# Patient Record
Sex: Male | Born: 1995 | Race: Black or African American | Hispanic: No | Marital: Single | Smoking: Never smoker
Health system: Southern US, Community
[De-identification: ages and names within clinical notes are randomized; demographics above are authoritative.]

## PROBLEM LIST (undated history)

## (undated) HISTORY — PX: APPENDECTOMY: SHX54

---
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
## 2014-04-18 IMAGING — CR DG ANKLE COMPLETE 3+V*R*
3 series · 3 of 3 positions shown · non-contrast
Comparison: None.

CLINICAL DATA: Right ankle pain

RIGHT ANKLE - COMPLETE 3+ VIEW

[x ankle ap right]
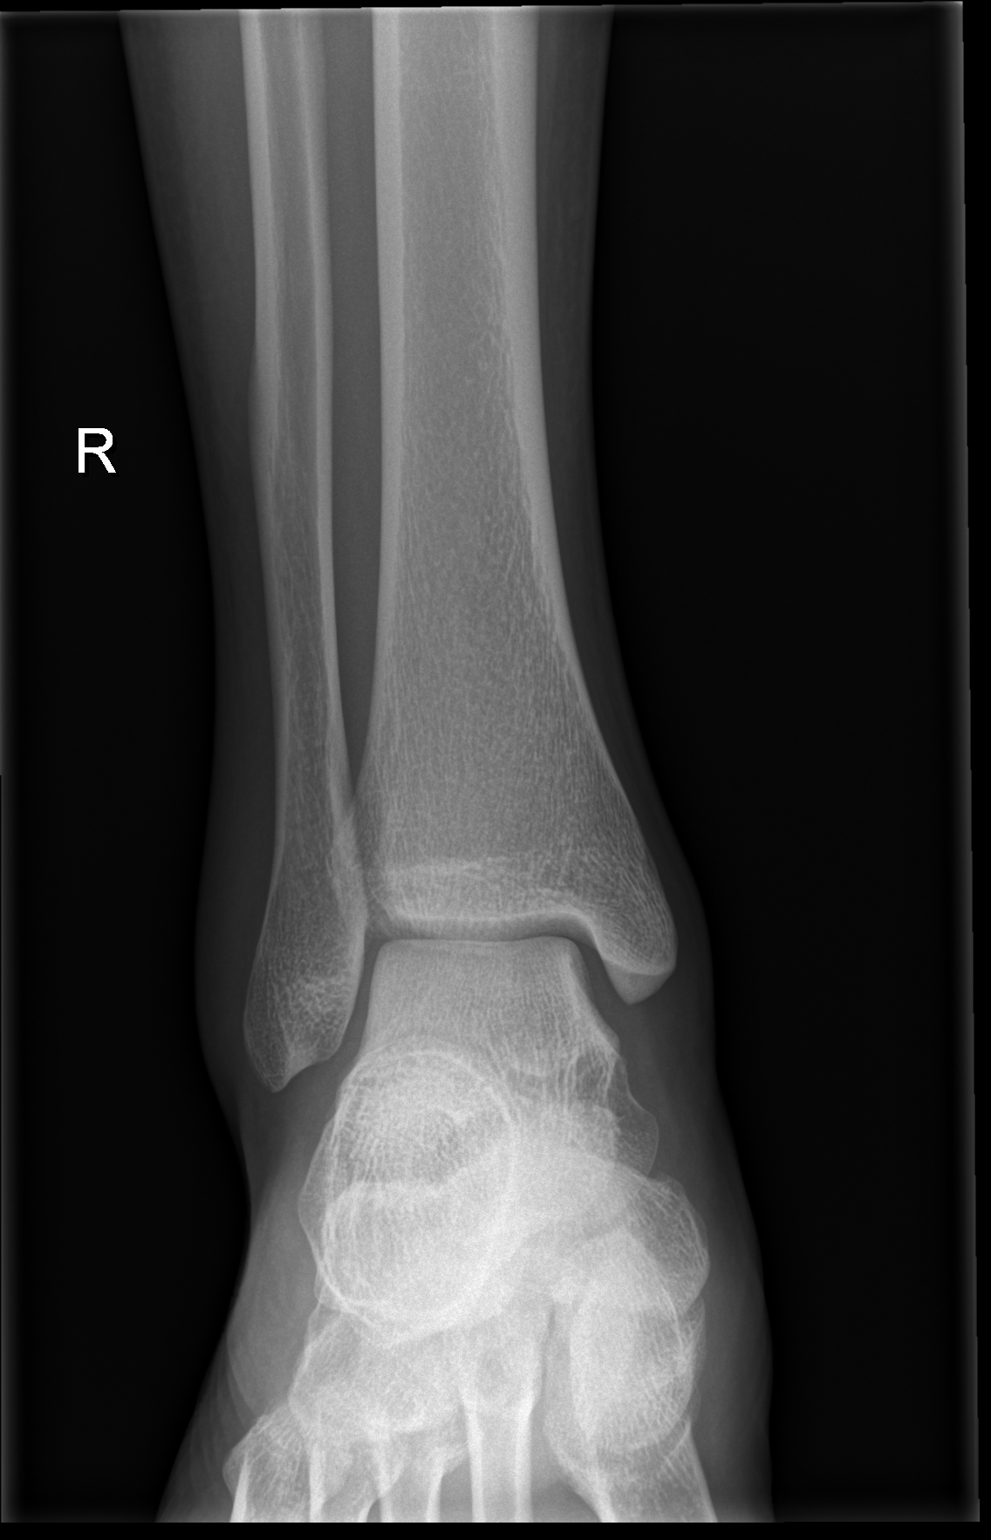

[x ankle obl right]
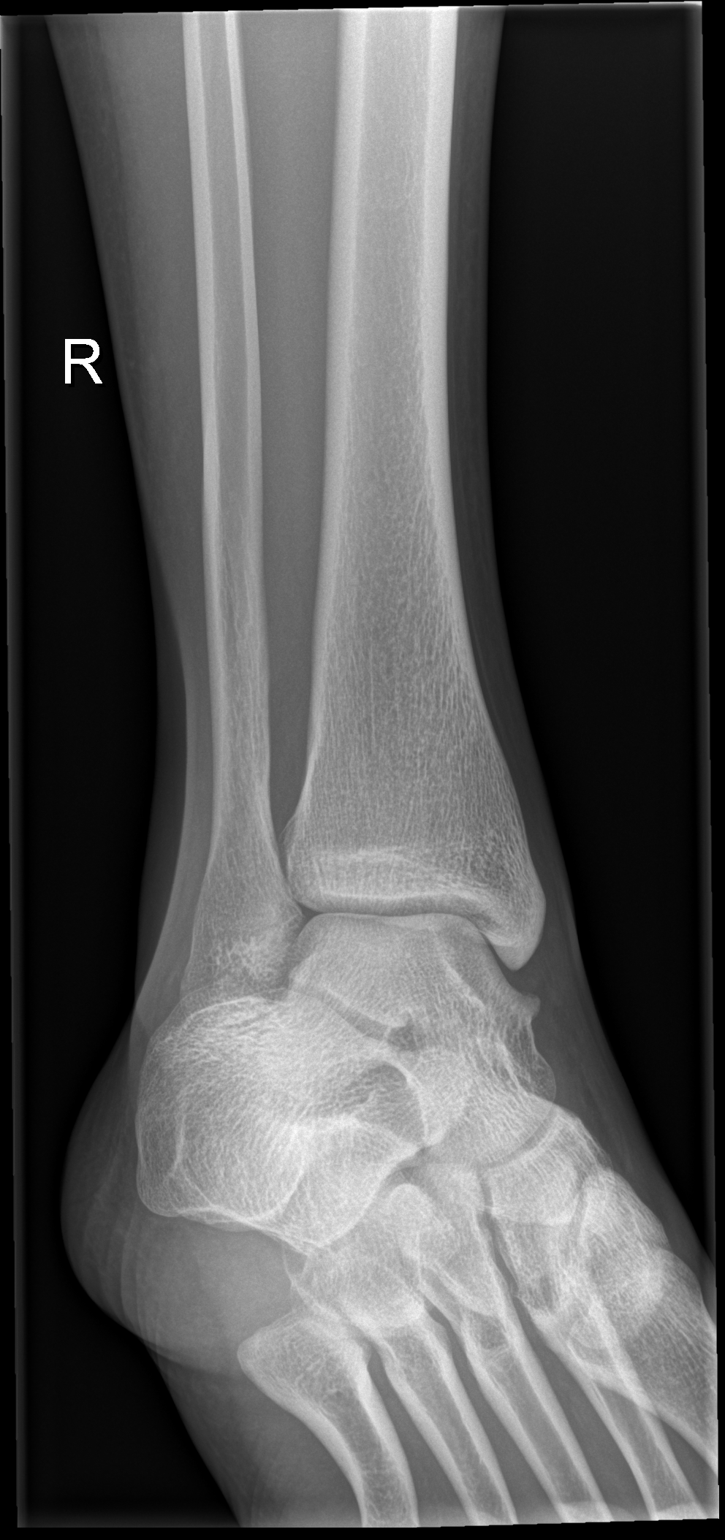

[x ankle lat right]
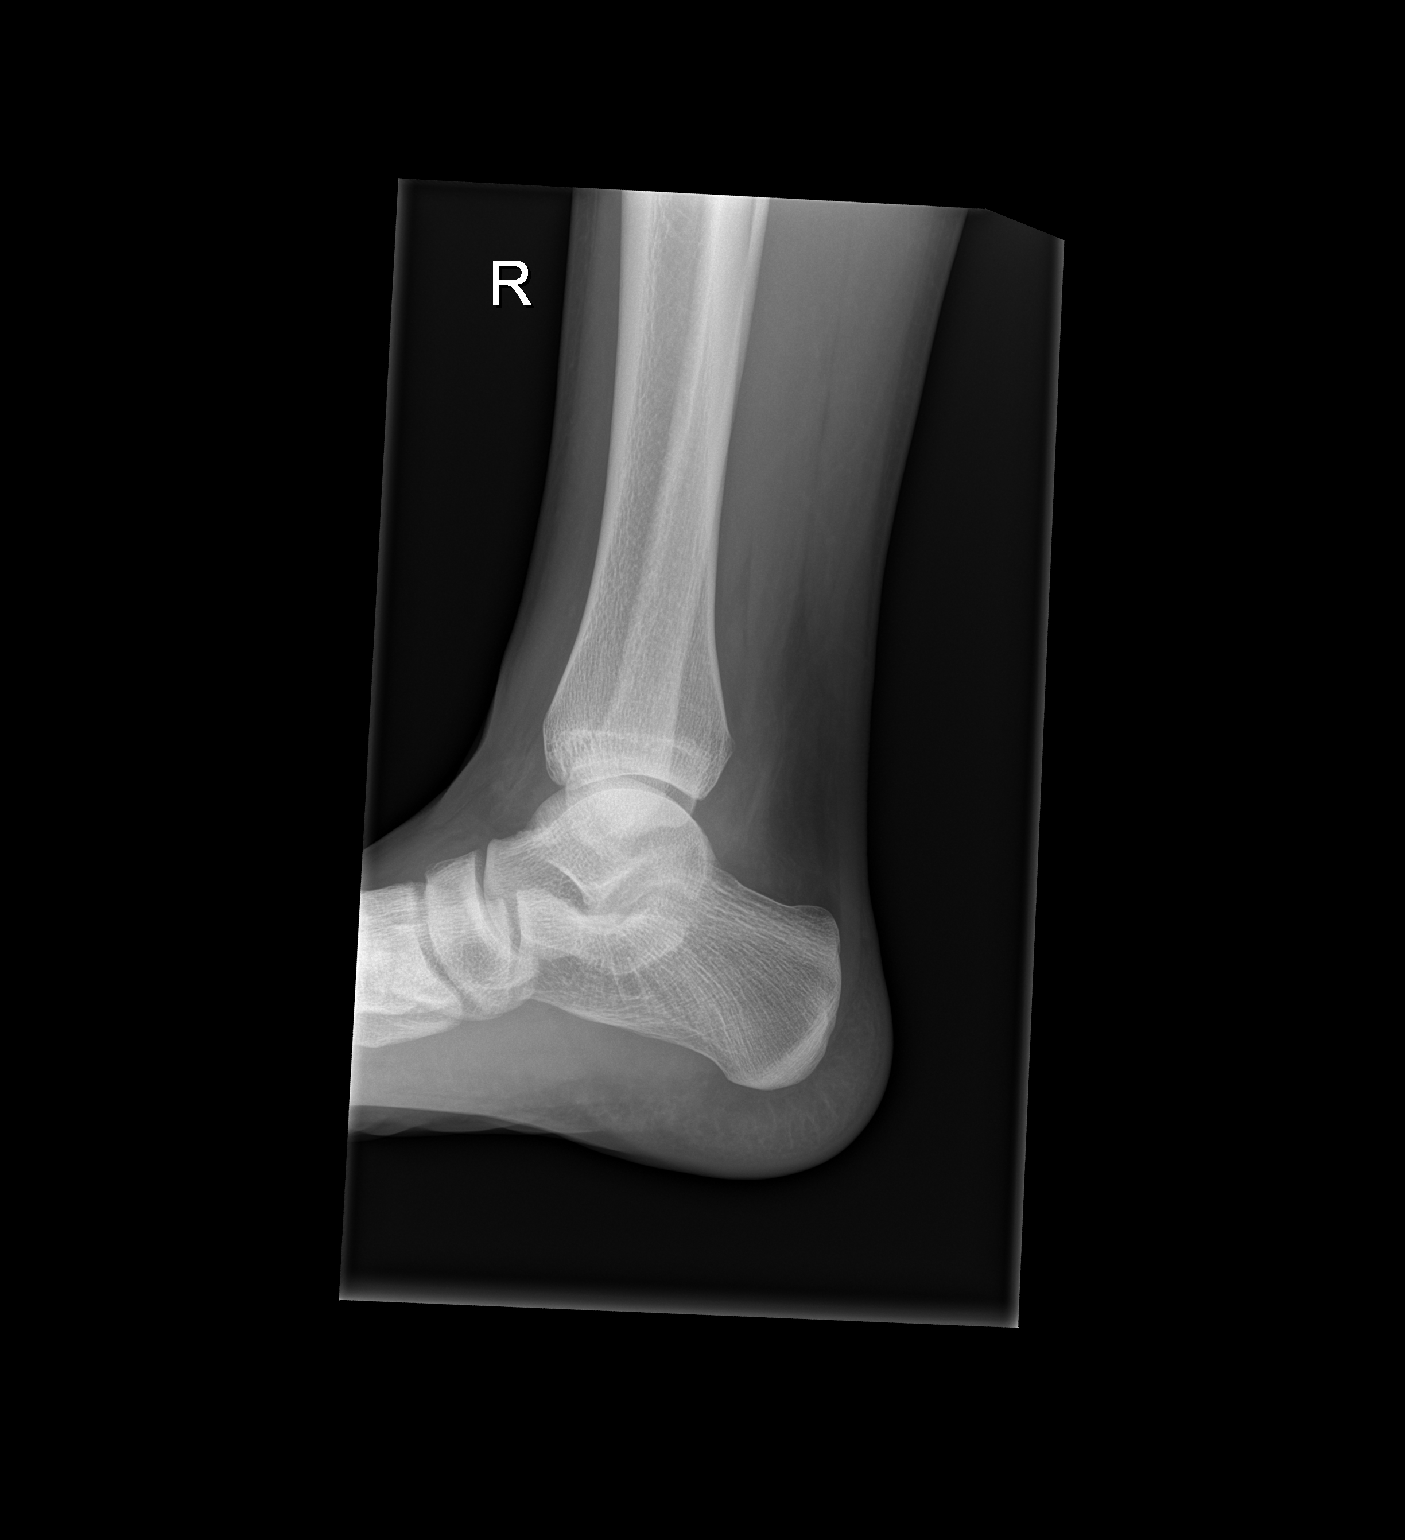

[3 of 3 positions shown; findings below may reference images not displayed]

FINDINGS: Diffuse soft tissue swelling, most pronounced overlying
the lateral malleolus.  Ankle mortise intact.  No displaced
fracture.  No dislocation.
IMPRESSION: Soft tissue swelling without acute osseous finding. If clinical
concern for a fracture persists, recommend a repeat radiograph in 5-
10 days to evaluate for interval change or callus formation.

## 2014-12-13 IMAGING — CR DG HAND COMPLETE 3+V*R*
3 series · 3 of 3 positions shown · non-contrast
Comparison: None.

CLINICAL DATA: Injury, pain.

RIGHT HAND - COMPLETE 3+ VIEW

[x hand pa right]
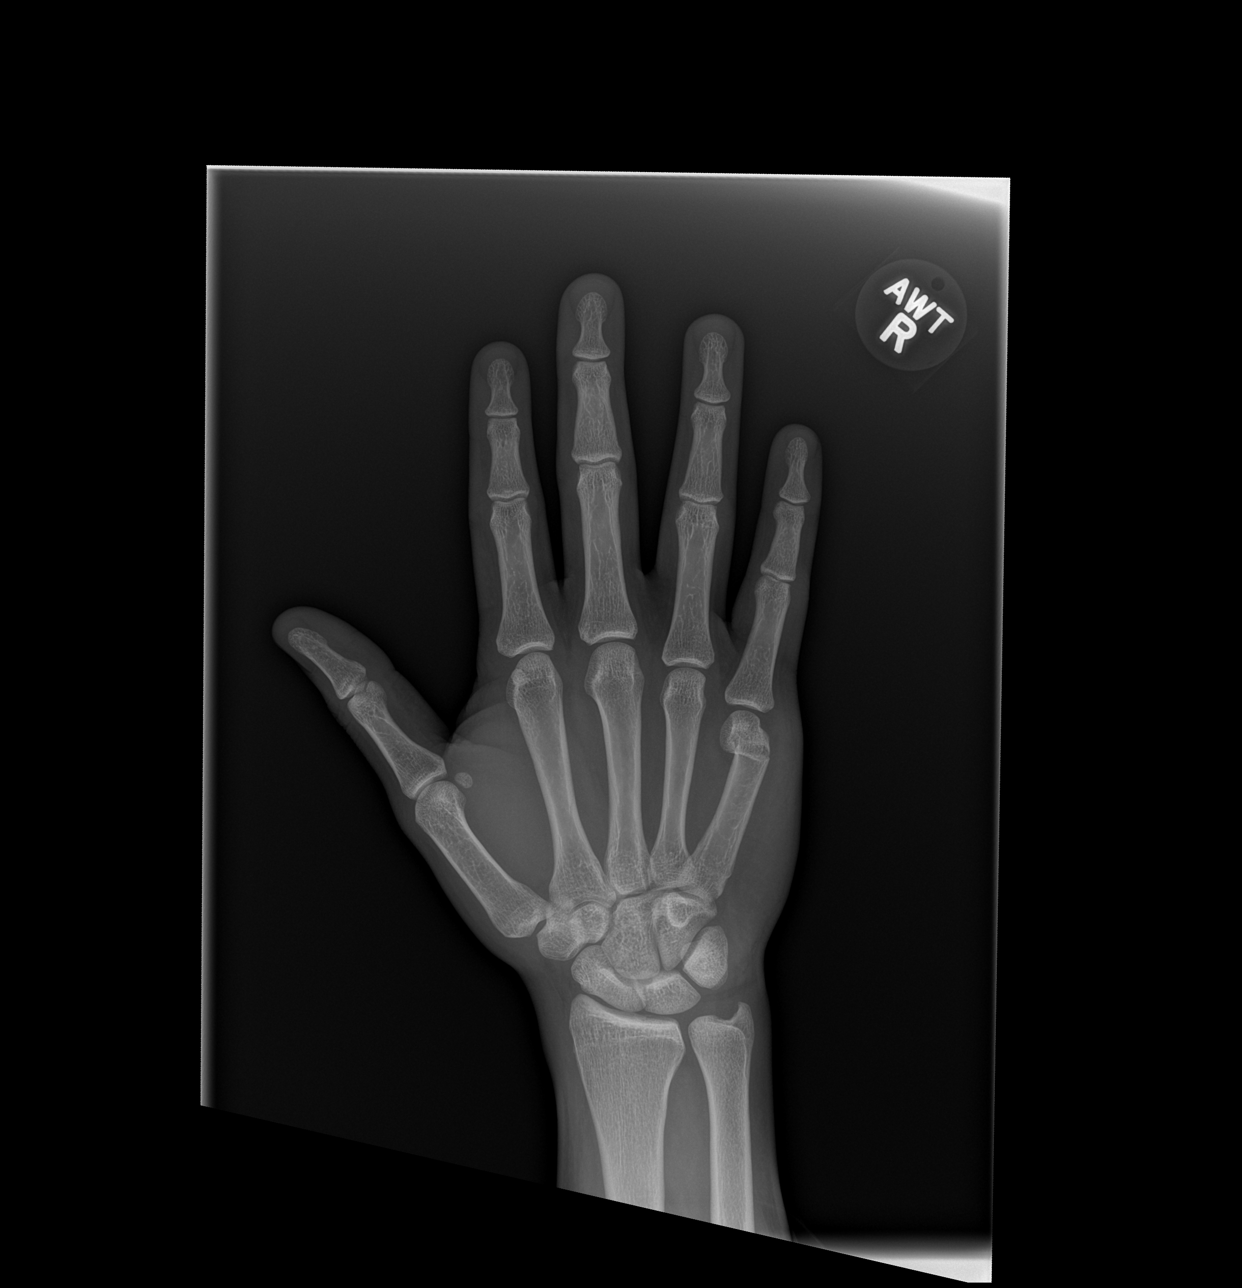

[x hand obl right]
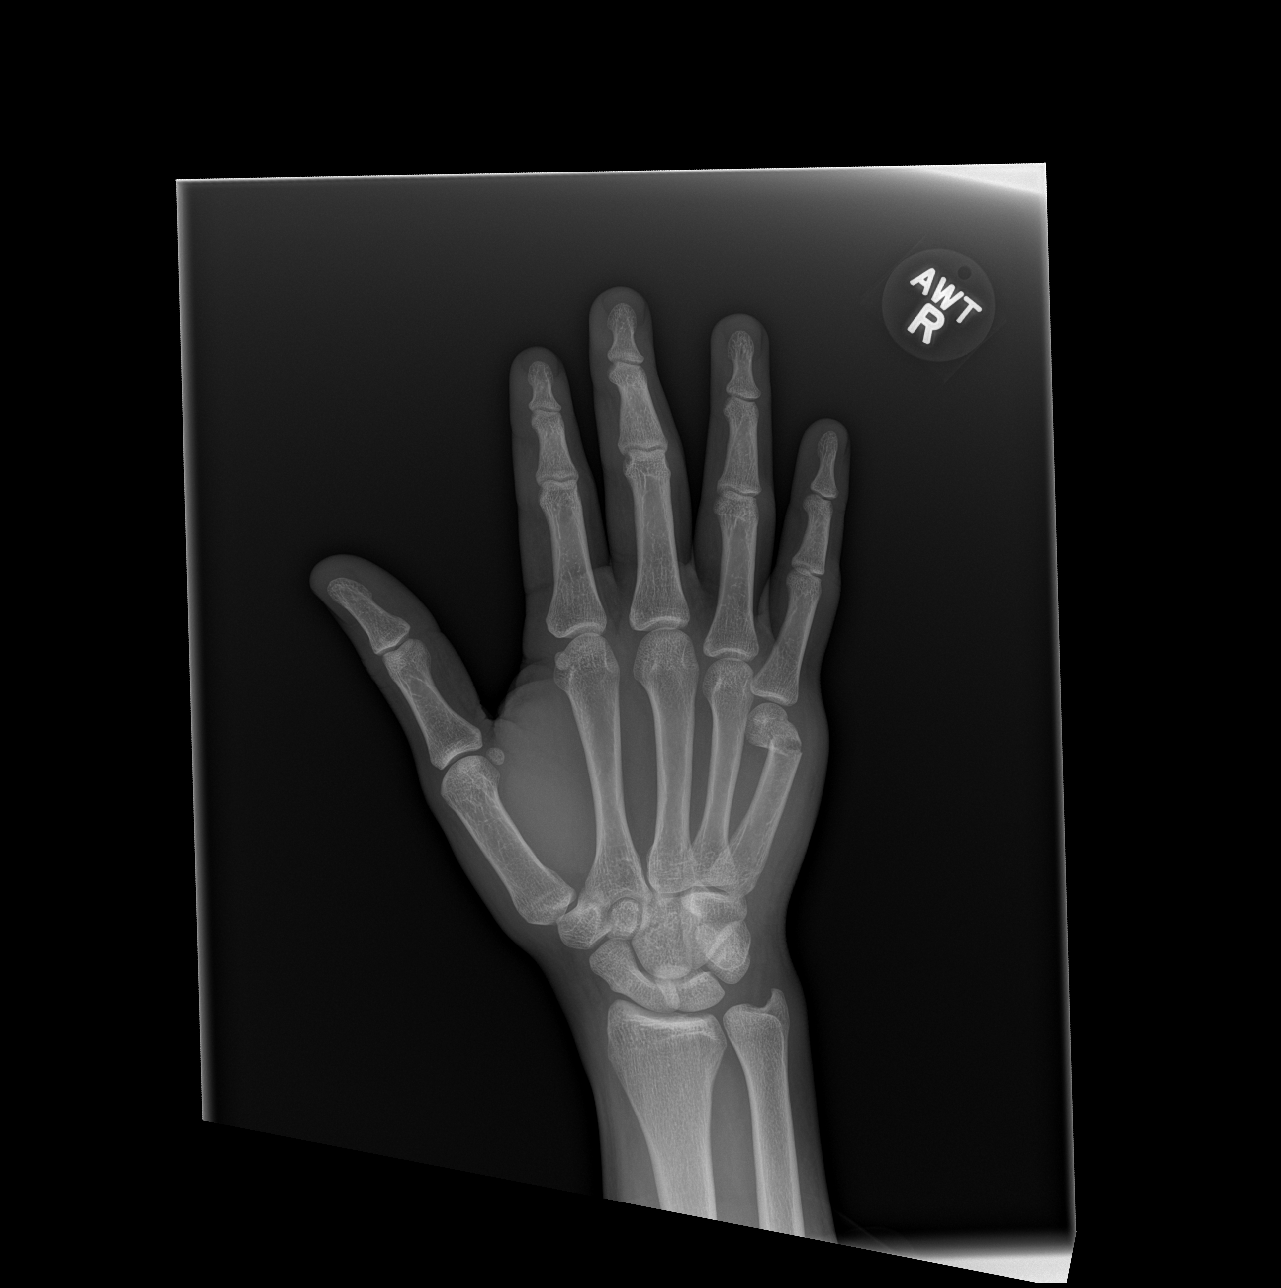

[x hand lat right]
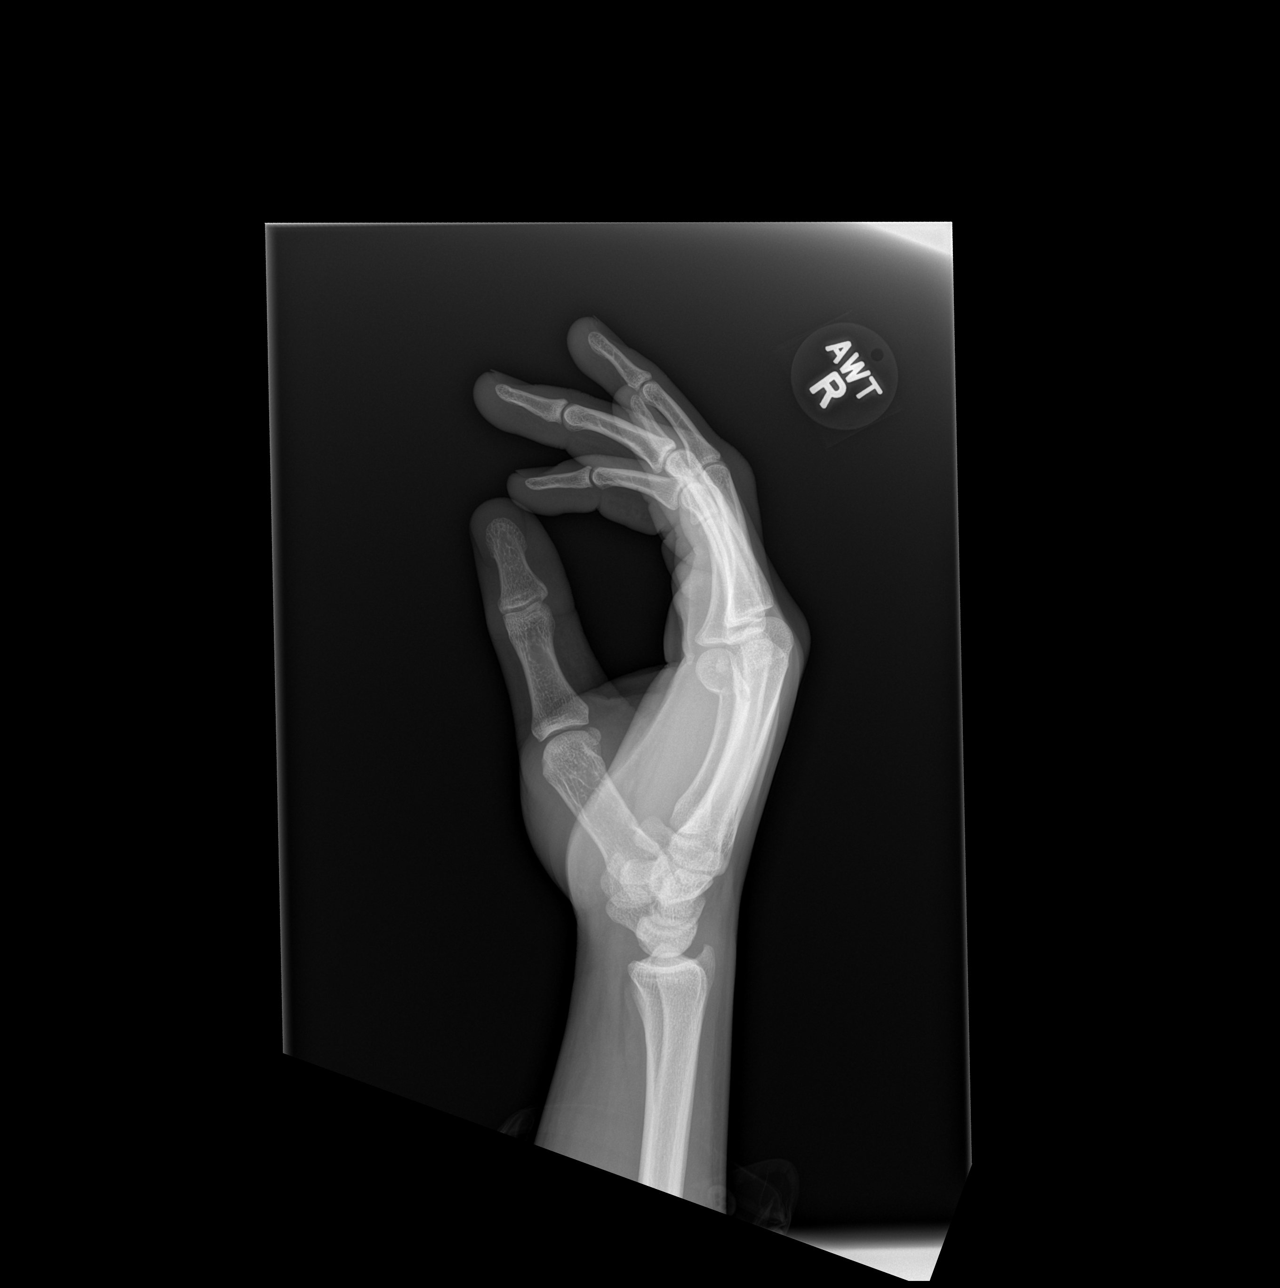

[3 of 3 positions shown; findings below may reference images not displayed]

FINDINGS: The patient has a fracture of the neck of the fifth
metacarpal with volar and medial angulation.  No other acute bony
or joint abnormality is identified.
IMPRESSION: Acute fracture neck of the fifth metacarpal.

## 2019-04-13 ENCOUNTER — Encounter (HOSPITAL_COMMUNITY): Payer: Self-pay | Admitting: Emergency Medicine

## 2019-04-13 ENCOUNTER — Telehealth: Payer: Self-pay | Admitting: *Deleted

## 2019-04-13 ENCOUNTER — Other Ambulatory Visit: Payer: Self-pay

## 2019-04-13 ENCOUNTER — Emergency Department (HOSPITAL_COMMUNITY)
Admission: EM | Admit: 2019-04-13 | Discharge: 2019-04-13 | Disposition: A | Discharge status: Home / Self Care | Payer: 59 | Attending: Emergency Medicine | Admitting: Emergency Medicine

## 2019-04-13 DIAGNOSIS — Z202 Contact with and (suspected) exposure to infections with a predominantly sexual mode of transmission: Secondary | ICD-10-CM | POA: Insufficient documentation

## 2019-04-13 DIAGNOSIS — N5089 Other specified disorders of the male genital organs: Secondary | ICD-10-CM | POA: Diagnosis present

## 2019-04-13 DIAGNOSIS — Z79899 Other long term (current) drug therapy: Secondary | ICD-10-CM | POA: Insufficient documentation

## 2019-04-13 DIAGNOSIS — Z711 Person with feared health complaint in whom no diagnosis is made: Secondary | ICD-10-CM

## 2019-04-13 DIAGNOSIS — N485 Ulcer of penis: Secondary | ICD-10-CM | POA: Diagnosis not present

## 2019-04-13 LAB — RPR
RPR Ser Ql: REACTIVE — AB
RPR Titer: 1:8 {titer}

## 2019-04-13 LAB — RAPID HIV SCREEN (HIV 1/2 AB+AG)
HIV 1/2 Antibodies: NONREACTIVE
HIV-1 P24 Antigen - HIV24: REACTIVE — AB

## 2019-04-13 MED ORDER — DOXYCYCLINE HYCLATE 100 MG PO CAPS: 100.0000 mg | ORAL_CAPSULE | Freq: Two times a day (BID) | ORAL | 0 refills | Status: AC

## 2019-04-13 MED ORDER — CEFTRIAXONE SODIUM 500 MG IJ SOLR
500.0000 mg | Freq: Once | INTRAMUSCULAR | Status: AC
  Administered 2019-04-13: 500 mg via INTRAMUSCULAR
  Filled 2019-04-13: qty 500

## 2019-04-13 MED ORDER — AZITHROMYCIN 250 MG PO TABS
1000.0000 mg | ORAL_TABLET | Freq: Once | ORAL | Status: AC
  Administered 2019-04-13: 1000 mg via ORAL
  Filled 2019-04-13: qty 4

## 2019-04-13 MED ORDER — LIDOCAINE HCL (PF) 1 % IJ SOLN
INTRAMUSCULAR | Status: AC
  Administered 2019-04-13: 1 mL
  Filled 2019-04-13: qty 5

## 2019-04-13 NOTE — Telephone Encounter (Signed)
Sent referral to [facility] per Dr Hatcher. Wynette Jersey M, RN  

## 2019-04-13 NOTE — Discharge Instructions (Signed)
You were seen today for a penile lesion.  While you report trauma, it is highly suspicious for possible STD.  You were tested and treated.  Syphilis, HIV, and culture testing is pending.  You will receive a phone call if any if this is positive.  You should abstain from sexual activity for the next 10 to 14 days and until the lesion has completely healed.  Continue to apply antibiotic ointment.

## 2019-04-13 NOTE — ED Triage Notes (Signed)
Pt reports he injured his penis w/ the zipper on his pants.  Pt states the wound has continued to get worse.

## 2019-04-13 NOTE — ED Provider Notes (Signed)
[facility] EMERGENCY DEPARTMENT Provider Note   CSN: [id number] Arrival date & time: 04/13/19  0106     History Chief Complaint  Patient presents with  . Penis Injury    Billy Lee is a 24 y.o. male.  HPI     This is a 24 year old male who presents with a nonhealing wound on his penis.  Patient reports that at the end of February he was "talking on the phone with some girlfriends" and pulled up his pants when his zipper caught his pants.  He reports he has had an injury like this before.  However, he states that he has been unhealed.  He reports pain even when he wears boxers.  He does report high risk sexual activity and does not consistently use condoms.  Denies any penile discharge.  He has been using moisturizer with minimal relief.  No known history of STDs.  History reviewed. No pertinent past medical history.  There are no problems to display for this patient.   Past Surgical History:  Procedure Laterality Date  . APPENDECTOMY         No family history on file.  Social History   Tobacco Use  . Smoking status: Never Smoker  . Smokeless tobacco: Never Used  Substance Use Topics  . Alcohol use: No  . Drug use: No    Home Medications Prior to Admission medications   Medication Sig Start Date End Date Taking? Authorizing Provider  doxycycline (VIBRAMYCIN) 100 MG capsule Take 1 capsule (100 mg total) by mouth 2 (two) times daily. 04/13/19   Horton, Barbette Hair, MD  HYDROcodone-acetaminophen (NORCO/VICODIN) 5-325 MG per tablet Take 1 tablet by mouth every 4 (four) hours as needed for moderate pain or severe pain. 06/09/13   Clayton Bibles, PA-C  penicillin v potassium (VEETID) 500 MG tablet Take 1 tablet (500 mg total) by mouth 4 (four) times daily. 06/28/13   Montine Circle, PA-C    Allergies    Patient has no known allergies.  Review of Systems   Review of Systems  Constitutional: Negative for fever.  Respiratory: Negative for shortness  of breath.   Cardiovascular: Negative for chest pain.  Gastrointestinal: Negative for abdominal pain.  Genitourinary: Positive for penile pain. Negative for discharge and penile swelling.  All other systems reviewed and are negative.   Physical Exam Updated Vital Signs BP 118/68   Pulse 68   Temp 98.3 F (36.8 C)   Resp 18   SpO2 100%   Physical Exam Vitals and nursing note reviewed.  Constitutional:      Appearance: He is well-developed.  HENT:     Head: Normocephalic and atraumatic.  Eyes:     Pupils: Pupils are equal, round, and reactive to light.  Cardiovascular:     Rate and Rhythm: Normal rate and regular rhythm.  Pulmonary:     Effort: Pulmonary effort is normal. No respiratory distress.  Abdominal:     Palpations: Abdomen is soft.     Tenderness: There is no abdominal tenderness.  Genitourinary:    Comments: Circumcised penis, no obvious discharge, there is a large ulcerative lesion at the base of the glans with an erythematous base, is tender to palpation, no oozing noted no other obvious lesions Musculoskeletal:     Cervical back: Neck supple.  Skin:    General: Skin is warm and dry.     Findings: No rash.     Comments: No rashes noted particularly on the palms or soles  Neurological:     Mental Status: He is alert and oriented to person, place, and time.  Psychiatric:        Mood and Affect: Mood normal.     ED Results / Procedures / Treatments   Labs (all labs ordered are listed, but only abnormal results are displayed) Labs Reviewed  HSV CULTURE AND TYPING  RPR  RAPID HIV SCREEN (HIV 1/2 AB+AG)  GC/CHLAMYDIA PROBE AMP ([place]) NOT AT [facility]    EKG None  Radiology No results found.  Procedures Procedures (including critical care time)  Medications Ordered in ED Medications  azithromycin (ZITHROMAX) tablet 1,000 mg (1,000 mg Oral Given 04/13/19 0541)  cefTRIAXone (ROCEPHIN) injection 500 mg (500 mg Intramuscular Given 04/13/19 0541)   lidocaine (PF) (XYLOCAINE) 1 % injection (1 mL  Given 04/13/19 0545)    ED Course  I have reviewed the triage vital signs and the nursing notes.  Pertinent labs & imaging results that were available during my care of the patient were reviewed by me and considered in my medical decision making (see chart for details).    MDM Rules/Calculators/A&P                       Patient presents with injury and ulceration to the glans of his penis.  Worsening over last 2 weeks.  Lesion is highly suspicious for primary ulcer and does not appear consistent with an injury.  Patient does have high risk sexual behaviors.  It could be chancroid vs syphilis.  Less likely is herpes.  Patient was tested and treated.  He was given azithromycin, Rocephin, and will be discharged with doxycycline.  RPR, HIV, and HSV testing are pending.  We discussed safe sex practices.  Instructed to use topical antibiotic ointment.  After history, exam, and medical workup I feel the patient has been appropriately medically screened and is safe for discharge home. Pertinent diagnoses were discussed with the patient. Patient was given return precautions.   Final Clinical Impression(s) / ED Diagnoses Final diagnoses:  Penile ulcer  Concern about STD in male without diagnosis    Rx / DC Orders ED Discharge Orders         Ordered    doxycycline (VIBRAMYCIN) 100 MG capsule  2 times daily     04/13/19 0604           Horton, Mayer Masker, MD 04/13/19 [phone]

## 2019-04-14 LAB — GC/CHLAMYDIA PROBE AMP (~~LOC~~) NOT AT ARMC
Chlamydia: POSITIVE — AB
Neisseria Gonorrhea: POSITIVE — AB

## 2019-04-14 LAB — T.PALLIDUM AB, TOTAL: T Pallidum Abs: REACTIVE — AB

## 2019-04-15 LAB — HSV CULTURE AND TYPING

## 2019-04-15 LAB — RNA QUALITATIVE: HIV 1 RNA Qualitative: 1

## 2019-04-15 LAB — HIV-1/2 AB - DIFFERENTIATION
HIV 1 Ab: NEGATIVE
HIV 2 Ab: NEGATIVE
Note: NEGATIVE

## 2021-11-04 ENCOUNTER — Encounter (HOSPITAL_COMMUNITY): Payer: Self-pay

## 2021-11-04 ENCOUNTER — Emergency Department (HOSPITAL_COMMUNITY)
Admission: EM | Admit: 2021-11-04 | Discharge: 2021-11-04 | Discharge status: Against Medical Advice | Payer: No Typology Code available for payment source

## 2021-11-04 ENCOUNTER — Emergency Department (HOSPITAL_COMMUNITY)
Admission: EM | Admit: 2021-11-04 | Discharge: 2021-11-04 | Disposition: A | Discharge status: Home / Self Care | Payer: Self-pay | Attending: Emergency Medicine | Admitting: Emergency Medicine

## 2021-11-04 ENCOUNTER — Other Ambulatory Visit: Payer: Self-pay

## 2021-11-04 DIAGNOSIS — Z041 Encounter for examination and observation following transport accident: Secondary | ICD-10-CM | POA: Diagnosis present

## 2021-11-04 DIAGNOSIS — Y92410 Unspecified street and highway as the place of occurrence of the external cause: Secondary | ICD-10-CM | POA: Insufficient documentation
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
## 2021-11-04 NOTE — ED Notes (Signed)
Pt left [facility] due to long wait times. Pt stated they would seek medical attention at [facility].

## 2021-11-04 NOTE — Discharge Instructions (Signed)
Return to the emergency department if you develop any new and/or concerning symptoms. °

## 2021-12-18 DIAGNOSIS — Z0389 Encounter for observation for other suspected diseases and conditions ruled out: Secondary | ICD-10-CM | POA: Diagnosis not present

## 2021-12-18 DIAGNOSIS — Z3009 Encounter for other general counseling and advice on contraception: Secondary | ICD-10-CM | POA: Diagnosis not present

## 2021-12-18 DIAGNOSIS — Z1388 Encounter for screening for disorder due to exposure to contaminants: Secondary | ICD-10-CM | POA: Diagnosis not present

## 2021-12-27 DIAGNOSIS — Z419 Encounter for procedure for purposes other than remedying health state, unspecified: Secondary | ICD-10-CM | POA: Diagnosis not present

## 2022-01-27 DIAGNOSIS — Z419 Encounter for procedure for purposes other than remedying health state, unspecified: Secondary | ICD-10-CM | POA: Diagnosis not present

## 2022-02-27 DIAGNOSIS — Z419 Encounter for procedure for purposes other than remedying health state, unspecified: Secondary | ICD-10-CM | POA: Diagnosis not present

## 2022-03-28 DIAGNOSIS — Z419 Encounter for procedure for purposes other than remedying health state, unspecified: Secondary | ICD-10-CM | POA: Diagnosis not present

## 2022-04-28 DIAGNOSIS — Z419 Encounter for procedure for purposes other than remedying health state, unspecified: Secondary | ICD-10-CM | POA: Diagnosis not present

## 2022-05-28 DIAGNOSIS — Z419 Encounter for procedure for purposes other than remedying health state, unspecified: Secondary | ICD-10-CM | POA: Diagnosis not present

## 2022-06-28 DIAGNOSIS — Z419 Encounter for procedure for purposes other than remedying health state, unspecified: Secondary | ICD-10-CM | POA: Diagnosis not present

## 2022-07-28 DIAGNOSIS — Z419 Encounter for procedure for purposes other than remedying health state, unspecified: Secondary | ICD-10-CM | POA: Diagnosis not present

## 2022-08-28 DIAGNOSIS — Z419 Encounter for procedure for purposes other than remedying health state, unspecified: Secondary | ICD-10-CM | POA: Diagnosis not present

## 2022-09-28 DIAGNOSIS — Z419 Encounter for procedure for purposes other than remedying health state, unspecified: Secondary | ICD-10-CM | POA: Diagnosis not present

## 2022-10-28 DIAGNOSIS — Z419 Encounter for procedure for purposes other than remedying health state, unspecified: Secondary | ICD-10-CM | POA: Diagnosis not present

## 2022-11-28 DIAGNOSIS — Z419 Encounter for procedure for purposes other than remedying health state, unspecified: Secondary | ICD-10-CM | POA: Diagnosis not present

## 2022-12-28 DIAGNOSIS — Z419 Encounter for procedure for purposes other than remedying health state, unspecified: Secondary | ICD-10-CM | POA: Diagnosis not present

## 2023-01-28 DIAGNOSIS — Z419 Encounter for procedure for purposes other than remedying health state, unspecified: Secondary | ICD-10-CM | POA: Diagnosis not present

## 2023-02-28 DIAGNOSIS — Z419 Encounter for procedure for purposes other than remedying health state, unspecified: Secondary | ICD-10-CM | POA: Diagnosis not present

## 2023-03-28 DIAGNOSIS — Z419 Encounter for procedure for purposes other than remedying health state, unspecified: Secondary | ICD-10-CM | POA: Diagnosis not present

## 2023-05-09 DIAGNOSIS — Z419 Encounter for procedure for purposes other than remedying health state, unspecified: Secondary | ICD-10-CM | POA: Diagnosis not present

## 2023-06-08 DIAGNOSIS — Z419 Encounter for procedure for purposes other than remedying health state, unspecified: Secondary | ICD-10-CM | POA: Diagnosis not present

## 2023-07-09 DIAGNOSIS — Z419 Encounter for procedure for purposes other than remedying health state, unspecified: Secondary | ICD-10-CM | POA: Diagnosis not present

## 2023-08-08 DIAGNOSIS — Z419 Encounter for procedure for purposes other than remedying health state, unspecified: Secondary | ICD-10-CM | POA: Diagnosis not present

## 2023-09-08 DIAGNOSIS — Z419 Encounter for procedure for purposes other than remedying health state, unspecified: Secondary | ICD-10-CM | POA: Diagnosis not present

## 2023-10-09 DIAGNOSIS — Z419 Encounter for procedure for purposes other than remedying health state, unspecified: Secondary | ICD-10-CM | POA: Diagnosis not present

## 2023-11-27 ENCOUNTER — Emergency Department (HOSPITAL_BASED_OUTPATIENT_CLINIC_OR_DEPARTMENT_OTHER)
Admission: EM | Admit: 2023-11-27 | Discharge: 2023-11-28 | Disposition: A | Discharge status: Home / Self Care | Attending: Emergency Medicine | Admitting: Emergency Medicine

## 2023-11-27 ENCOUNTER — Encounter (HOSPITAL_BASED_OUTPATIENT_CLINIC_OR_DEPARTMENT_OTHER): Payer: Self-pay

## 2023-11-27 DIAGNOSIS — K648 Other hemorrhoids: Secondary | ICD-10-CM | POA: Diagnosis not present

## 2023-11-27 DIAGNOSIS — K6289 Other specified diseases of anus and rectum: Secondary | ICD-10-CM | POA: Diagnosis present

## 2023-11-27 LAB — CBC
HCT: 36.3 % — ABNORMAL LOW (ref 39.0–52.0)
Hemoglobin: 12 g/dL — ABNORMAL LOW (ref 13.0–17.0)
MCH: 29.1 pg (ref 26.0–34.0)
MCHC: 33.1 g/dL (ref 30.0–36.0)
MCV: 88.1 fL (ref 80.0–100.0)
Platelets: 176 K/uL (ref 150–400)
RBC: 4.12 MIL/uL — ABNORMAL LOW (ref 4.22–5.81)
RDW: 11.9 % (ref 11.5–15.5)
WBC: 4.4 K/uL (ref 4.0–10.5)
nRBC: 0 % (ref 0.0–0.2)

## 2023-11-27 LAB — COMPREHENSIVE METABOLIC PANEL WITH GFR
ALT: 15 U/L (ref 0–44)
AST: 16 U/L (ref 15–41)
Albumin: 4.5 g/dL (ref 3.5–5.0)
Alkaline Phosphatase: 57 U/L (ref 38–126)
Anion gap: 7 (ref 5–15)
BUN: 14 mg/dL (ref 6–20)
CO2: 26 mmol/L (ref 22–32)
Calcium: 9.7 mg/dL (ref 8.9–10.3)
Chloride: 106 mmol/L (ref 98–111)
Creatinine, Ser: 1.06 mg/dL (ref 0.61–1.24)
GFR, Estimated: >60 mL/min (ref >60)
Glucose, Bld: 104 mg/dL — ABNORMAL HIGH (ref 70–99)
Potassium: 4.4 mmol/L (ref 3.5–5.1)
Sodium: 139 mmol/L (ref 135–145)
Total Bilirubin: 0.3 mg/dL (ref 0.0–1.2)
Total Protein: 7.3 g/dL (ref 6.5–8.1)

## 2023-11-27 LAB — LIPASE, BLOOD: Lipase: 14 U/L (ref 11–51)

## 2023-11-27 MED ORDER — OXYCODONE-ACETAMINOPHEN 5-325 MG PO TABS
1.0000 | ORAL_TABLET | ORAL | Status: DC | PRN
  Administered 2023-11-27: 1 via ORAL
  Filled 2023-11-27: qty 1

## 2023-11-27 MED ORDER — LIDOCAINE HCL URETHRAL/MUCOSAL 2 % EX GEL
1.0000 | Freq: Once | CUTANEOUS | Status: AC
  Administered 2023-11-27: 1 via TOPICAL
  Filled 2023-11-27: qty 11

## 2023-11-27 NOTE — ED Triage Notes (Signed)
 Patient reports increased rectal pain and swelling. It has been going on for a week but is getting worse and he now has rectal bleeding with purulent drainage. She says it is very uncomfortable and tender to touch and position changes.

## 2023-11-28 MED ORDER — POLYETHYLENE GLYCOL 3350 17 G PO PACK
17.0000 g | PACK | Freq: Every day | ORAL | 1 refills | Status: DC
Start: 2023-11-28 — End: 2023-11-28

## 2023-11-28 MED ORDER — POLYETHYLENE GLYCOL 3350 17 G PO PACK: 17.0000 g | PACK | Freq: Every day | ORAL | 1 refills | Status: AC

## 2023-11-28 NOTE — ED Provider Notes (Signed)
 [facility] Emergency Department Provider Note MRN:  [id number]  Arrival date & time: 11/28/23     Chief Complaint   Rectal Pain   History of Present Illness   Billy Lee is a 28 y.o. year-old male with no pertinent past medical history presenting to the ED with chief complaint of rectal pain.  Patient has had intermittent rectal pain during defecation over the past several weeks.  Today after a bowel movement had considerably worse pain that would not go away and felt a bulge near the anal region, here for evaluation.  Review of Systems  A thorough review of systems was obtained and all systems are negative except as noted in the HPI and PMH.   Patient's Health History   History reviewed. No pertinent past medical history.  Past Surgical History:  Procedure Laterality Date   APPENDECTOMY      History reviewed. No pertinent family history.  Social History   Socioeconomic History   Marital status: Single    Spouse name: Not on file   Number of children: Not on file   Years of education: Not on file   Highest education level: Not on file  Occupational History   Not on file  Tobacco Use   Smoking status: Never   Smokeless tobacco: Never  Substance and Sexual Activity   Alcohol use: No   Drug use: No   Sexual activity: Not on file  Other Topics Concern   Not on file  Social History Narrative   Not on file   Social Drivers of Health   Financial Resource Strain: Not on file  Food Insecurity: Not on file  Transportation Needs: Not on file  Physical Activity: Not on file  Stress: Not on file  Social Connections: Not on file  Intimate Partner Violence: Not on file     Physical Exam   Vitals:   11/27/23 2129  BP: (!) 105/48  Pulse: 61  Resp: 16  Temp: 98.2 F (36.8 C)  SpO2: 98%    CONSTITUTIONAL: Well-appearing, NAD NEURO/PSYCH:  Alert and oriented x 3, no focal deficits EYES:  eyes equal and reactive ENT/NECK:  no LAD, no  JVD CARDIO: Regular rate, well-perfused, normal S1 and S2 PULM:  CTAB no wheezing or rhonchi GI/GU:  non-distended, non-tender MSK/SPINE:  No gross deformities, no edema SKIN:  no rash, atraumatic   *Additional and/or pertinent findings included in MDM below  Diagnostic and Interventional Summary    EKG Interpretation Date/Time:    Ventricular Rate:    PR Interval:    QRS Duration:    QT Interval:    QTC Calculation:   R Axis:      Text Interpretation:         Labs Reviewed  COMPREHENSIVE METABOLIC PANEL WITH GFR - Abnormal; Notable for the following components:      Result Value   Glucose, Bld 104 (*)    All other components within normal limits  CBC - Abnormal; Notable for the following components:   RBC 4.12 (*)    Hemoglobin 12.0 (*)    HCT 36.3 (*)    All other components within normal limits  LIPASE, BLOOD  URINALYSIS, ROUTINE W REFLEX MICROSCOPIC    No orders to display    Medications  oxyCODONE-acetaminophen  (PERCOCET/ROXICET) 5-325 MG per tablet 1 tablet (1 tablet Oral Given 11/27/23 2311)  lidocaine  (XYLOCAINE ) 2 % jelly 1 Application (1 Application Topical Given 11/27/23 2350)     Procedures  /  Critical Care Hemorrhoid reduction  Date/Time: 11/28/2023 12:43 AM  Performed by: Theadore Ozell HERO, MD Authorized by: Theadore Ozell HERO, MD  Consent: Verbal consent obtained Risks and benefits: risks, benefits and alternatives were discussed Local anesthesia used: yes  Anesthesia: Local anesthesia used: yes Local Anesthetic: topical anesthetic  Sedation: Patient sedated: no  Patient tolerance: patient tolerated the procedure well with no immediate complications Comments: Reduction of prolapsed hemorrhoids with gentle reinsertion.     ED Course and Medical Decision Making  Initial Impression and Ddx Exam is consistent with prolapsed hemorrhoids causing patient [place] of discomfort.  I see no evidence of infection or abscess, abdomen soft and  nontender.  Past medical/surgical history that increases complexity of ED encounter: None  Interpretation of Diagnostics I personally reviewed the labs and there is no significant blood count or electrolyte disturbance.  CT briefly considered but felt to be unnecessary given the physical exam.  Patient Reassessment and Ultimate Disposition/Management     Able to apply lidocaine  externally to the hemorrhoids and then they were easily reduced.  Appropriate for discharge with follow-up.  Patient management required discussion with the following services or consulting groups:  None  Complexity of Problems Addressed Acute illness or injury that poses threat of life of bodily function  Additional Data Reviewed and Analyzed Further history obtained from: Further history from spouse/family member  Additional Factors Impacting ED Encounter Risk Prescriptions and Minor Procedures  Ozell HERO. Theadore, MD [facility] Emergency Medicine [facility] [email or web address]  Final Clinical Impressions(s) / ED Diagnoses     ICD-10-CM   1. Prolapsed hemorrhoids  K64.8       ED Discharge Orders          Ordered    polyethylene glycol (MIRALAX) 17 g packet  Daily        11/28/23 0040             Discharge Instructions Discussed with and Provided to Patient:    Discharge Instructions      You were evaluated in the Emergency Department and after careful evaluation, we did not find any emergent condition requiring admission or further testing in the hospital.  Your exam/testing today is overall reassuring.  Symptoms seem to be due to hemorrhoids that had prolapse.  We were able to reduce your hemorrhoids here in the emergency department.  Use the MiraLAX 2 or 3 times daily to achieve soft frequent stools with the main goal of avoiding straining.  Follow-up closely with the general surgeons for further care.  Please return to the Emergency Department if you experience any  worsening of your condition.   Thank you for allowing us  to be a part of your care.      Theadore Ozell HERO, MD 11/28/23 [phone]

## 2023-11-28 NOTE — Discharge Instructions (Signed)
 You were evaluated in the Emergency Department and after careful evaluation, we did not find any emergent condition requiring admission or further testing in the hospital.  Your exam/testing today is overall reassuring.  Symptoms seem to be due to hemorrhoids that had prolapse.  We were able to reduce your hemorrhoids here in the emergency department.  Use the MiraLAX 2 or 3 times daily to achieve soft frequent stools with the main goal of avoiding straining.  Follow-up closely with the general surgeons for further care.  Please return to the Emergency Department if you experience any worsening of your condition.   Thank you for allowing us  to be a part of your care.
